# Patient Record
Sex: Male | Born: 1985 | Hispanic: Yes | Marital: Married
Health system: Southern US, Community
[De-identification: ages and names within clinical notes are randomized; demographics above are authoritative.]

---

## 2018-08-08 ENCOUNTER — Emergency Department: Payer: BLUE CROSS/BLUE SHIELD

## 2018-08-08 ENCOUNTER — Encounter: Payer: Self-pay | Admitting: Emergency Medicine

## 2018-08-08 ENCOUNTER — Other Ambulatory Visit: Payer: Self-pay

## 2018-08-08 ENCOUNTER — Emergency Department
Admission: EM | Admit: 2018-08-08 | Discharge: 2018-08-08 | Disposition: A | Discharge status: Home / Self Care | Payer: BLUE CROSS/BLUE SHIELD | Attending: Emergency Medicine | Admitting: Emergency Medicine

## 2018-08-08 DIAGNOSIS — R0789 Other chest pain: Secondary | ICD-10-CM | POA: Diagnosis present

## 2018-08-08 DIAGNOSIS — Z79899 Other long term (current) drug therapy: Secondary | ICD-10-CM | POA: Diagnosis not present

## 2018-08-08 DIAGNOSIS — M940 Chondrocostal junction syndrome [Tietze]: Secondary | ICD-10-CM | POA: Insufficient documentation

## 2018-08-08 LAB — COMPREHENSIVE METABOLIC PANEL
ALT: 42 U/L (ref 0–44)
AST: 33 U/L (ref 15–41)
Albumin: 5 g/dL (ref 3.5–5.0)
Alkaline Phosphatase: 46 U/L (ref 38–126)
Anion gap: 10 (ref 5–15)
BUN: 17 mg/dL (ref 6–20)
CO2: 22 mmol/L (ref 22–32)
Calcium: 9.5 mg/dL (ref 8.9–10.3)
Chloride: 107 mmol/L (ref 98–111)
Creatinine, Ser: 0.98 mg/dL (ref 0.61–1.24)
GFR calc Af Amer: >60 mL/min (ref >60)
GFR calc non Af Amer: >60 mL/min (ref >60)
Glucose, Bld: 98 mg/dL (ref 70–99)
Potassium: 3.7 mmol/L (ref 3.5–5.1)
Sodium: 139 mmol/L (ref 135–145)
Total Bilirubin: 0.5 mg/dL (ref 0.3–1.2)
Total Protein: 8.1 g/dL (ref 6.5–8.1)

## 2018-08-08 LAB — CBC WITH DIFFERENTIAL/PLATELET
Abs Immature Granulocytes: 0.03 10*3/uL (ref 0.00–0.07)
Basophils Absolute: 0.1 10*3/uL (ref 0.0–0.1)
Basophils Relative: 0 %
Eosinophils Absolute: 0.1 10*3/uL (ref 0.0–0.5)
Eosinophils Relative: 1 %
HCT: 46.6 % (ref 39.0–52.0)
Hemoglobin: 15.9 g/dL (ref 13.0–17.0)
Immature Granulocytes: 0 %
Lymphocytes Relative: 25 %
Lymphs Abs: 2.9 10*3/uL (ref 0.7–4.0)
MCH: 30.3 pg (ref 26.0–34.0)
MCHC: 34.1 g/dL (ref 30.0–36.0)
MCV: 88.8 fL (ref 80.0–100.0)
Monocytes Absolute: 0.7 10*3/uL (ref 0.1–1.0)
Monocytes Relative: 6 %
Neutro Abs: 7.7 10*3/uL (ref 1.7–7.7)
Neutrophils Relative %: 68 %
Platelets: 235 10*3/uL (ref 150–400)
RBC: 5.25 MIL/uL (ref 4.22–5.81)
RDW: 12.2 % (ref 11.5–15.5)
WBC: 11.4 10*3/uL — ABNORMAL HIGH (ref 4.0–10.5)
nRBC: 0 % (ref 0.0–0.2)

## 2018-08-08 LAB — PROTIME-INR
INR: 1 (ref 0.8–1.2)
Prothrombin Time: 12.6 seconds (ref 11.4–15.2)

## 2018-08-08 LAB — TROPONIN I: Troponin I: <0.03 ng/mL (ref <0.03)

## 2018-08-08 LAB — FIBRIN DERIVATIVES D-DIMER (ARMC ONLY): Fibrin derivatives D-dimer (ARMC): 214.04 ng/mL (FEU) (ref 0.00–499.00)

## 2018-08-08 MED ORDER — DEXAMETHASONE SODIUM PHOSPHATE 10 MG/ML IJ SOLN
10.0000 mg | Freq: Once | INTRAMUSCULAR | Status: AC
  Administered 2018-08-08: 10 mg via INTRAVENOUS
  Filled 2018-08-08: qty 1

## 2018-08-08 MED ORDER — PREDNISONE 10 MG PO TABS: 10.0000 mg | ORAL_TABLET | Freq: Every day | ORAL | 0 refills | Status: AC

## 2018-08-08 NOTE — ED Provider Notes (Signed)
EKG read and interpreted by me shows normal sinus rhythm rate of 79 normal axis no acute ST-T wave changes   Arnaldo Natal, MD 08/08/18 1943

## 2018-08-08 NOTE — ED Triage Notes (Signed)
States R shoulder and mid back pain x 1 week. Denies fall or injury. Also states has been having some nose bleeds for past 3 months however stop with pressure. Denies use of blood thinners.

## 2018-08-08 NOTE — ED Provider Notes (Signed)
[facility] Emergency Department Provider Note  ____________________________________________  Time seen: Approximately 6:57 PM  I have reviewed the triage vital signs and the nursing notes.   HISTORY  Chief Complaint Back Pain and Epistaxis    HPI Roger Wyatt Roger Wyatt is a 33 y.o. male who presents the emergency department complaining of right-sided chest and rib pain.  Patient reports that approximately 3 weeks ago he developed back, right-sided chest pain.  Patient was seen in urgent care, placed on meloxicam and muscle relaxer.  Patient reports that his back pain has significantly improved but he is still having intermittent chest and right-sided rib pain.  Patient states that it is sometimes reproducible to palpation.  Often times it is not reproducible.  It is sometimes associated with deep breathing, but most of the time does not appear to be associated with any factor.  Patient denies any palpitations.  No cardiac history.  Patient denies any respiratory symptoms of nasal congestion, sore throat, cough.  He denies any abdominal pain, nausea or vomiting.   Arrival complaint was listed as epistaxis by triage.  Patient denies any epistaxis currently or in the recent past.        History reviewed. No pertinent past medical history.  There are no active problems to display for this patient.   History reviewed. No pertinent surgical history.  Prior to Admission medications   Medication Sig Start Date End Date Taking? Authorizing Provider  meloxicam (MOBIC) 15 MG tablet Take 15 mg by mouth daily.   Yes [provider]  methocarbamol (ROBAXIN) 500 MG tablet Take 500 mg by mouth 4 (four) times daily.   Yes [provider]  predniSONE (DELTASONE) 10 MG tablet Take 1 tablet (10 mg total) by mouth daily. 08/08/18   Katalyna Socarras, Delorise Royals, PA-C    Allergies Patient has no known allergies.  No family history on file.  Social  History Social History   Tobacco Use  . Smoking status: Not on file  Substance Use Topics  . Alcohol use: Not on file  . Drug use: Not on file     Review of Systems  Constitutional: No fever/chills Eyes: No visual changes. No discharge ENT: No upper respiratory complaints. Cardiovascular: Positive for right-sided chest/rib pain. Respiratory: no cough. No SOB. Gastrointestinal: No abdominal pain.  No nausea, no vomiting.  No diarrhea.  No constipation. Genitourinary: Negative for dysuria. No hematuria Musculoskeletal: Negative for musculoskeletal pain. Skin: Negative for rash, abrasions, lacerations, ecchymosis. Neurological: Negative for headaches, focal weakness or numbness. 10-point ROS otherwise negative.  ____________________________________________   PHYSICAL EXAM:  VITAL SIGNS: ED Triage Vitals [08/08/18 1801]  Enc Vitals Group     BP (!) 149/87     Pulse Rate 80     Resp 20     Temp 98.5 F (36.9 C)     Temp Source Oral     SpO2 98 %     Weight 258 lb (117 kg)     Height 6\' 1"  (1.854 m)     Head Circumference      Peak Flow      Pain Score 2     Pain Loc      Pain Edu?      Excl. in GC?      Constitutional: Alert and oriented. Well appearing and in no acute distress. Eyes: Conjunctivae are normal. PERRL. EOMI. Head: Atraumatic. ENT:      Ears:       Nose: No congestion/rhinnorhea.  Mouth/Throat: Mucous membranes are moist.  Neck: No stridor.    Cardiovascular: Normal rate, regular rhythm. Normal S1 and S2.  Good peripheral circulation. Respiratory: Normal respiratory effort without tachypnea or retractions. Lungs CTAB. Good air entry to the bases with no decreased or absent breath sounds. Gastrointestinal: Bowel sounds 4 quadrants. Soft and nontender to palpation. No guarding or rigidity. No palpable masses. No distention. No CVA tenderness. Musculoskeletal: Full range of motion to all extremities. No gross deformities appreciated.  Palpation  along posterior cervical and thoracic spine reveals no tenderness.  No visible findings to the ribs.  Equal chest rise and fall.  Patient has mild tenderness to palpation intercostal margins in the posterior ribs, 10 and 11.  This does not reproduce the pain however. Neurologic:  Normal speech and language. No gross focal neurologic deficits are appreciated.  Skin:  Skin is warm, dry and intact. No rash noted. Psychiatric: Mood and affect are normal. Speech and behavior are normal. Patient exhibits appropriate insight and judgement.   ____________________________________________   LABS (all labs ordered are listed, but only abnormal results are displayed)  Labs Reviewed  CBC WITH DIFFERENTIAL/PLATELET - Abnormal; Notable for the following components:      Result Value   WBC 11.4 (*)    All other components within normal limits  COMPREHENSIVE METABOLIC PANEL  TROPONIN I  FIBRIN DERIVATIVES D-DIMER ([facility] ONLY)  PROTIME-INR   ____________________________________________  EKG  ED ECG REPORT I, Delorise RoyalsJonathan D Doylene Splinter,  personally viewed and interpreted this ECG.   Date: 08/08/2018  EKG Time: 1943 hrs.  Rate: 79 bpm  Rhythm: normal EKG, normal sinus rhythm, there are no previous tracings available for comparison  Axis: Normal axis  Intervals:none  ST&T Change: No ST elevation or depression noted.   Normal sinus rhythm.  No STEMI.  ____________________________________________  RADIOLOGY I personally viewed and evaluated these images as part of my medical decision making, as well as reviewing the written report by the radiologist.  Dg Chest 2 View  Result Date: 08/08/2018 CLINICAL DATA:  Right shoulder mid back pain for a week. No trauma reported. EXAM: CHEST - 2 VIEW COMPARISON:  None. FINDINGS: The heart size and mediastinal contours are within normal limits. Both lungs are clear. The visualized skeletal structures are unremarkable. IMPRESSION: No active cardiopulmonary disease.  Electronically Signed   By: Gerome Samavid  Williams III M.D   On: 08/08/2018 19:27    ____________________________________________    PROCEDURES  Procedure(s) performed:    Procedures    Medications  dexamethasone (DECADRON) injection 10 mg (has no administration in time range)     ____________________________________________   INITIAL IMPRESSION / ASSESSMENT AND PLAN / ED COURSE  Pertinent labs & imaging results that were available during my care of the patient were reviewed by me and considered in my medical decision making (see chart for details).  Review of the [place] CSRS was performed in accordance of the [facility] prior to dispensing any controlled drugs.  Clinical Course as of Aug 08 2047  Mon Aug 08, 2018  [id number] Patient presented to the emergency department complaining of right-sided chest/rib pain.  Patient reports that he has had symptoms x3 weeks.  Patient had been placed on anti-inflammatories and muscle relaxers from urgent care for rib and lower back pain.  Patient reports that the back pain has significantly improved but his rib pain/chest pain continues.  It is sometimes reproducible with palpation of the chest but typically not reproducible.  Patient has had no cough, shortness  of breath.  He denies any palpitations.  On exam, patient does have some tenderness to palpation of intercostal margins but this does not reproduce the pain that he is experiencing.  Given intermittent pain, after being treated with anti-inflammatories, patient will be evaluated with labs and chest x-ray.  Differential includes costochondritis, pneumonia, bronchitis, spontaneous pneumothorax, PE, cholecystitis.   [JC]    Clinical Course User Index [JC] Eathon Valade, Delorise Royals, PA-C          Patient's diagnosis is consistent with costochondritis.  Patient presented to the emergency department complaining of right-sided chest/rib pain.  This is been ongoing x3 weeks.  Intermittently reproducible with  palpation.  Patient did have tenderness to palpation in the intercostal space with patient stated this was not the same pain.  Patient was evaluated with chest x-ray, labs, EKG.  These returned with reassuring results.  With intercostal rib pain on exam, symptoms ongoing x3 weeks, most likely diagnosis is costochondritis.  Patient will be prescribed steroids for same.  Follow-up primary care as needed..Patient is given ED precautions to return to the ED for any worsening or new symptoms.     ____________________________________________  FINAL CLINICAL IMPRESSION(S) / ED DIAGNOSES  Final diagnoses:  Costochondritis      NEW MEDICATIONS STARTED DURING THIS VISIT:  ED Discharge Orders         Ordered    predniSONE (DELTASONE) 10 MG tablet  Daily    Note to Pharmacy:  Take 6 pills x 2 days, 5 pills x 2 days, 4 pills x 2 days, 3 pills x 2 days, 2 pills x 2 days, and 1 pill x 2 days   08/08/18 2045              This chart was dictated using voice recognition software/Dragon. Despite best efforts to proofread, errors can occur which can change the meaning. Any change was purely unintentional.    Racheal Patches, PA-C 08/08/18 2049    Dionne Bucy, MD 08/08/18 2232

## 2020-05-23 IMAGING — CR CHEST - 2 VIEW
2 series · 2 of 2 positions shown · non-contrast
Comparison: None.

CLINICAL DATA: Right shoulder mid back pain for a week. No trauma
reported.

EXAM:
CHEST - 2 VIEW

[chest pa]
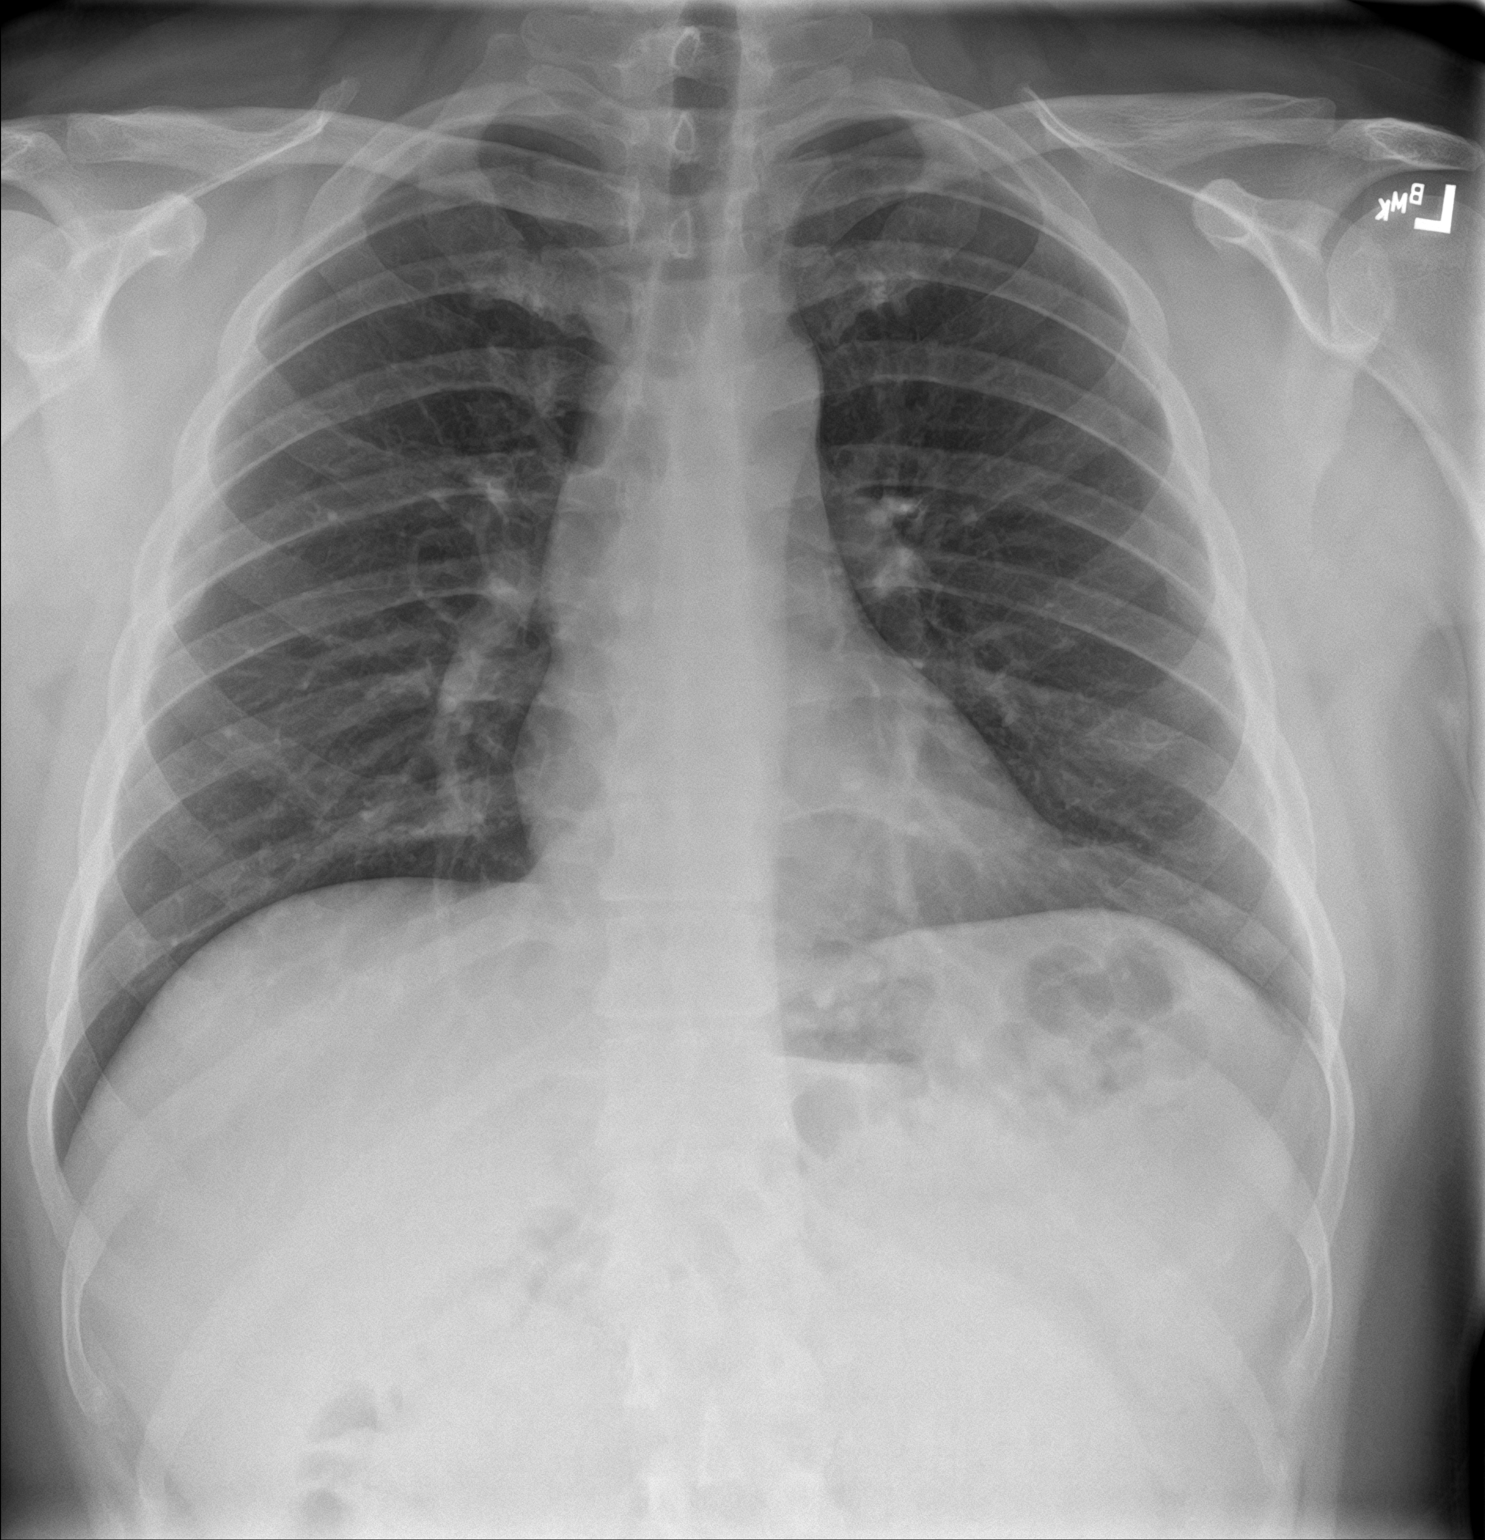

[chest lat]
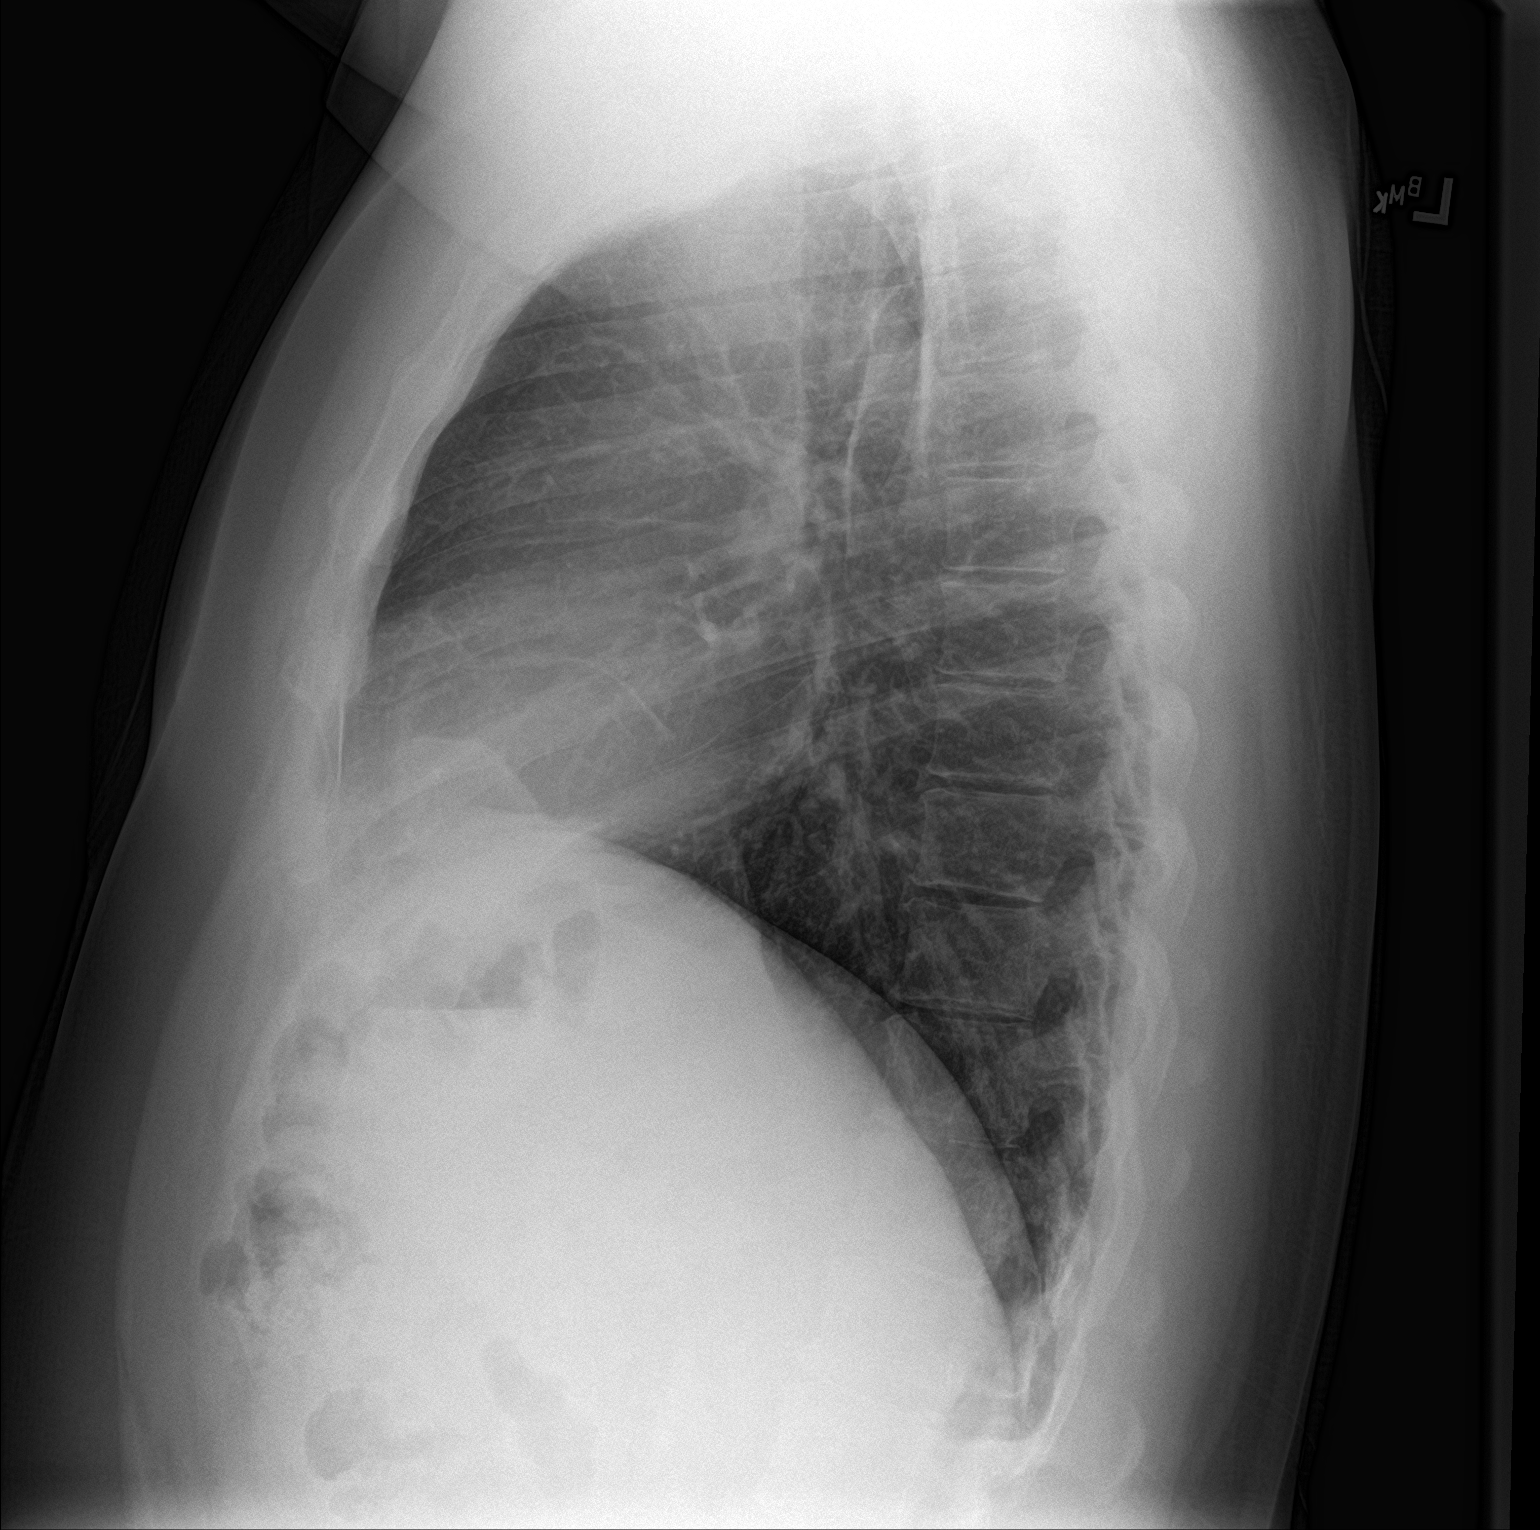

[2 of 2 positions shown; findings below may reference images not displayed]

FINDINGS: The heart size and mediastinal contours are within normal limits.
Both lungs are clear. The visualized skeletal structures are
unremarkable.
IMPRESSION: No active cardiopulmonary disease.
